# Patient Record
Sex: Male | Born: 1995 | Hispanic: No | Marital: Single | Smoking: Current every day smoker
Health system: Southern US, Community
[De-identification: ages and names within clinical notes are randomized; demographics above are authoritative.]

---

## 2020-01-24 ENCOUNTER — Emergency Department (INDEPENDENT_AMBULATORY_CARE_PROVIDER_SITE_OTHER): Payer: Self-pay

## 2020-01-24 ENCOUNTER — Other Ambulatory Visit: Payer: Self-pay

## 2020-01-24 ENCOUNTER — Emergency Department (INDEPENDENT_AMBULATORY_CARE_PROVIDER_SITE_OTHER)
Admission: EM | Admit: 2020-01-24 | Discharge: 2020-01-24 | Disposition: A | Discharge status: Home / Self Care | Payer: Self-pay | Source: Home / Self Care

## 2020-01-24 DIAGNOSIS — S61432A Puncture wound without foreign body of left hand, initial encounter: Secondary | ICD-10-CM

## 2020-01-24 DIAGNOSIS — Z23 Encounter for immunization: Secondary | ICD-10-CM

## 2020-01-24 DIAGNOSIS — W298XXA Contact with other powered powered hand tools and household machinery, initial encounter: Secondary | ICD-10-CM

## 2020-01-24 MED ORDER — TRAMADOL HCL 50 MG PO TABS: 50.0000 mg | ORAL_TABLET | Freq: Four times a day (QID) | ORAL | 0 refills | Status: AC | PRN

## 2020-01-24 MED ORDER — DOXYCYCLINE HYCLATE 100 MG PO CAPS: 100.0000 mg | ORAL_CAPSULE | Freq: Two times a day (BID) | ORAL | 0 refills | Status: AC

## 2020-01-24 MED ORDER — TETANUS-DIPHTH-ACELL PERTUSSIS 5-2.5-18.5 LF-MCG/0.5 IM SUSY
0.5000 mL | PREFILLED_SYRINGE | Freq: Once | INTRAMUSCULAR | Status: AC
  Administered 2020-01-24: 0.5 mL via INTRAMUSCULAR

## 2020-01-24 NOTE — ED Provider Notes (Signed)
[facility]    CSN: [id number] Arrival date & time: 01/24/20  1036      History   Chief Complaint Chief Complaint  Patient presents with  . Hand Injury    left    HPI Keith Arias is a 25 y.o. male.   HPI Keith Arias is a 25 y.o. male presenting to UC with c/o puncture wound to the palm of his Left hand that occurred around 9AM this morning while drilling through some wood. Bleeding controlled. Pain is 6/10. Denies weakness or numbness to his hand or fingers. No medication taken PTA.  He is Left hand dominant. Unknown last tetanus. Pt visiting from [place] for 1 month.    History reviewed. No pertinent past medical history.  There are no problems to display for this patient.   History reviewed. No pertinent surgical history.     Home Medications    Prior to Admission medications   Medication Sig Start Date End Date Taking? Authorizing Provider  doxycycline (VIBRAMYCIN) 100 MG capsule Take 1 capsule (100 mg total) by mouth 2 (two) times daily for 7 days. 01/24/20 01/31/20 Yes Davonda Ausley O, PA-C  traMADol (ULTRAM) 50 MG tablet Take 1 tablet (50 mg total) by mouth every 6 (six) hours as needed. 01/24/20  Yes Lurene Shadow, PA-C    Family History Family History  Problem Relation Age of Onset  . Healthy Mother   . Healthy Father     Social History Social History   Tobacco Use  . Smoking status: Current Every Day Smoker    Types: Cigars  . Smokeless tobacco: Never Used  . Tobacco comment: black & milds  Vaping Use  . Vaping Use: Never used  Substance Use Topics  . Alcohol use: Not Currently     Allergies   Patient has no known allergies.   Review of Systems Review of Systems  Skin: Positive for wound. Negative for color change.  Neurological: Negative for weakness and numbness.     Physical Exam Triage Vital Signs ED Triage Vitals  Enc Vitals Group     BP 01/24/20 1139 117/73     Pulse Rate 01/24/20 1139 93     Resp 01/24/20 1139  16     Temp 01/24/20 1139 97.9 F (36.6 C)     Temp Source 01/24/20 1139 Oral     SpO2 01/24/20 1139 100 %     Weight --      Height --      Head Circumference --      Peak Flow --      Pain Score 01/24/20 1137 6     Pain Loc --      Pain Edu? --      Excl. in GC? --    No data found.  Updated Vital Signs BP 117/73 (BP Location: Right Arm)   Pulse 93   Temp 97.9 F (36.6 C) (Oral)   Resp 16   SpO2 100%   Visual Acuity Right Eye Distance:   Left Eye Distance:   Bilateral Distance:    Right Eye Near:   Left Eye Near:    Bilateral Near:     Physical Exam Vitals and nursing note reviewed.  Constitutional:      Appearance: Normal appearance. He is well-developed and well-nourished.  HENT:     Head: Normocephalic and atraumatic.  Eyes:     Extraocular Movements: EOM normal.  Cardiovascular:     Rate and Rhythm: Normal rate.  Pulmonary:     Effort: Pulmonary effort is normal.  Musculoskeletal:        General: Tenderness present. No swelling. Normal range of motion.       Hands:     Cervical back: Normal range of motion.     Comments: Left hand and wrist: full ROM, mild tenderness around wound on palm aspect  Skin:    General: Skin is warm and dry.     Comments: Left hand, palm proximal aspect: 3cm puncture wound with jagged edges. No active bleeding. No foreign bodies. Mild tenderness.   Neurological:     Mental Status: He is alert and oriented to person, place, and time.  Psychiatric:        Mood and Affect: Mood and affect normal.        Behavior: Behavior normal.      UC Treatments / Results  Labs (all labs ordered are listed, but only abnormal results are displayed) Labs Reviewed - No data to display  EKG   Radiology DG Hand Complete Left  Result Date: 01/24/2020 CLINICAL DATA:  Drill bit extended into hand EXAM: LEFT HAND - COMPLETE 3+ VIEW COMPARISON:  None. FINDINGS: Frontal, oblique, and lateral views were obtained. No radiopaque foreign body  or soft tissue air evident. No fracture or dislocation. There is a small subchondral cyst in the fourth PIP joint. No associated joint space narrowing in this area. Other joint spaces appear normal. No erosive change. IMPRESSION: No fracture or dislocation. No soft tissue air or radiopaque foreign body. 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 Other joint spaces normal. Electronically Signed   By: Bretta Bang III M.D.   On: 01/24/2020 12:00    Procedures Laceration Repair  Date/Time: 01/24/2020 1:03 PM Performed by: Lurene Shadow, PA-C Authorized by: Lurene Shadow, PA-C   Consent:    Consent obtained:  Verbal   Consent given by:  Patient   Risks, benefits, and alternatives were discussed: yes     Risks discussed:  Infection, pain, poor cosmetic result, retained foreign body, tendon damage, poor wound healing, nerve damage and need for additional repair   Alternatives discussed:  No treatment and delayed treatment Anesthesia:    Anesthesia method:  None Laceration details:    Location:  Hand   Hand location:  L palm   Length (cm):  3   Depth (mm):  4 Pre-procedure details:    Preparation:  Patient was prepped and draped in usual sterile fashion Exploration:    Limited defect created (wound extended): no     Hemostasis achieved with:  Direct pressure   Imaging obtained: x-ray     Imaging outcome: foreign body not noted     Wound exploration: wound explored through full range of motion and entire depth of wound visualized     Wound extent: areolar tissue violated     Wound extent: no fascia violation noted, no foreign bodies/material noted, no muscle damage noted, no nerve damage noted, no tendon damage noted, no underlying fracture noted and no vascular damage noted     Contaminated: no   Treatment:    Area cleansed with:  Saline   Amount of cleaning:  Extensive   Irrigation solution:  Sterile water   Irrigation method:  Pressure wash   Debridement:  None    Undermining:  None Skin repair:    Repair method:  Steri-Strips   Number of Steri-Strips:  3 Approximation:    Approximation:  Close Repair  type:    Repair type:  Simple Post-procedure details:    Dressing:  Bulky dressing   Procedure completion:  Tolerated   (including critical care time)  Medications Ordered in UC Medications  Tdap (BOOSTRIX) injection 0.5 mL (0.5 mLs Intramuscular Given 01/24/20 1249)    Initial Impression / Assessment and Plan / UC Course  I have reviewed the triage vital signs and the nursing notes.  Pertinent labs & imaging results that were available during my care of the patient were reviewed by me and considered in my medical decision making (see chart for details).    Discussed imaging  Wound closed as noted above Tdap updated F/u later this week as needed  Final Clinical Impressions(s) / UC Diagnoses   Final diagnoses:  Puncture wound of left hand without foreign body, initial encounter     Discharge Instructions      Take the antibiotic as prescribed to help prevent infection. Keep bandage on for 24-48 hours then gently remove and gently clean wound. Do not soak your hand. Do not pick at the steri-strips and do not apply any cream or ointment on the tape. Keep wound covered with a bandage until healed. Change bandage once daily, more often if wet or dirty. Follow up with sports medicine later this week if not improving, or return to urgent care if needed.    ED Prescriptions    Medication Sig Dispense Auth. Provider   traMADol (ULTRAM) 50 MG tablet Take 1 tablet (50 mg total) by mouth every 6 (six) hours as needed. 10 tablet Waylan Rocher O, PA-C   doxycycline (VIBRAMYCIN) 100 MG capsule Take 1 capsule (100 mg total) by mouth 2 (two) times daily for 7 days. 14 capsule Lurene Shadow, [place]     I have reviewed the PDMP during this encounter.   Lurene Shadow, [place] 01/24/20 1305

## 2020-01-24 NOTE — ED Triage Notes (Signed)
Patient presents to Urgent Care with complaints of left hand injury since a drill accidentally went through it earlier today. Patient reports he has normal sensation and ROM, "it just hurts". Pt not sure how deep the drill went, bleeding controlled upon arrival. Pressure bandage applied. Unknown tetanus.

## 2020-01-24 NOTE — Discharge Instructions (Signed)
  Take the antibiotic as prescribed to help prevent infection. Keep bandage on for 24-48 hours then gently remove and gently clean wound. Do not soak your hand. Do not pick at the steri-strips and do not apply any cream or ointment on the tape. Keep wound covered with a bandage until healed. Change bandage once daily, more often if wet or dirty. Follow up with sports medicine later this week if not improving, or return to urgent care if needed.

## 2022-04-10 IMAGING — DX DG HAND COMPLETE 3+V*L*
3 series · 3 of 3 positions shown · non-contrast
Comparison: None.

CLINICAL DATA: Drill bit extended into hand

EXAM:
LEFT HAND - COMPLETE 3+ VIEW

[hand pa]
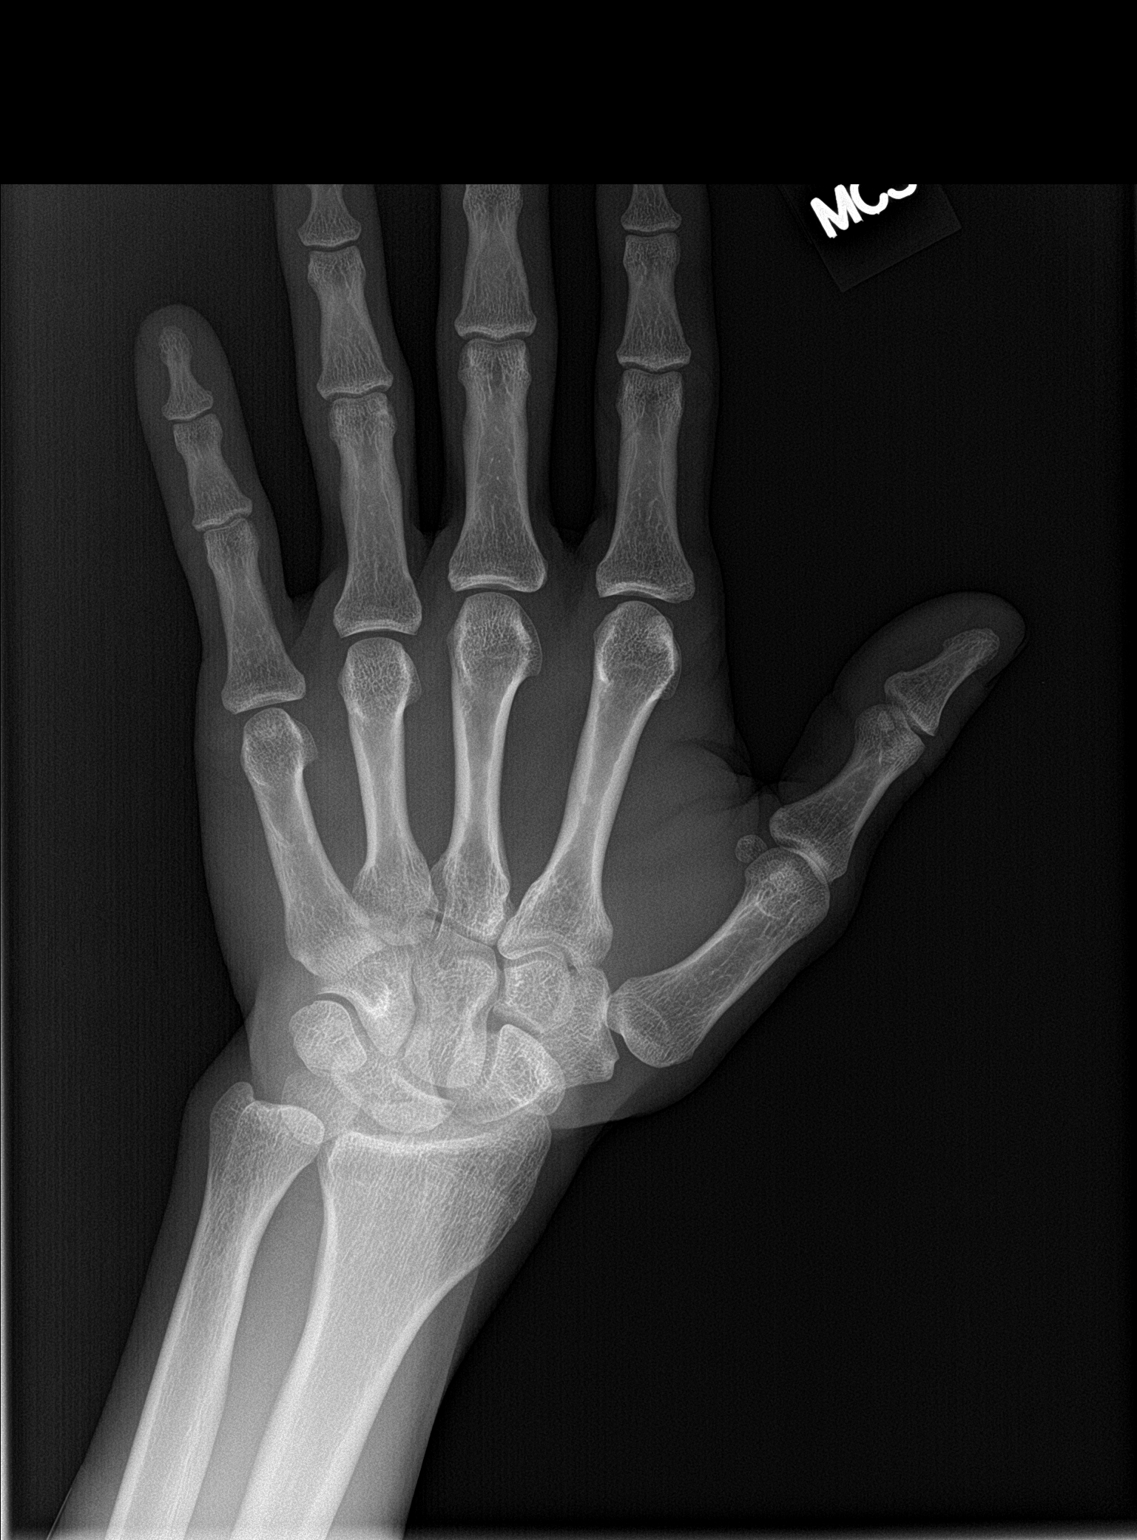

[hand obl]
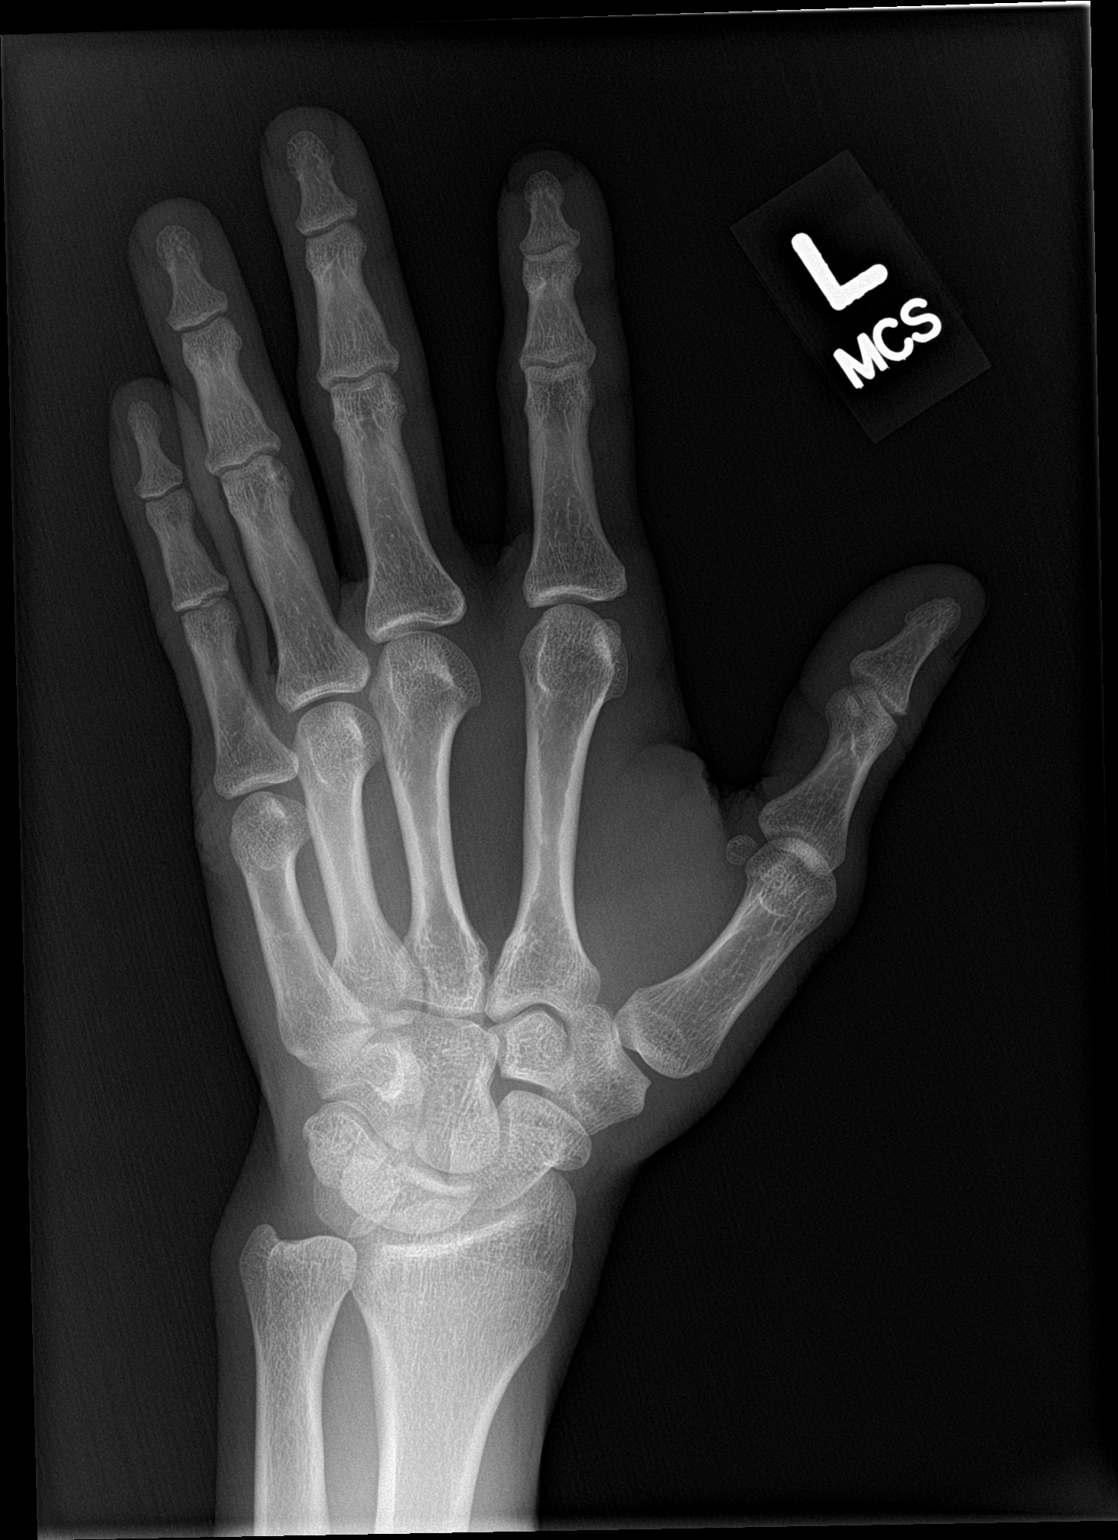

[hand lat]
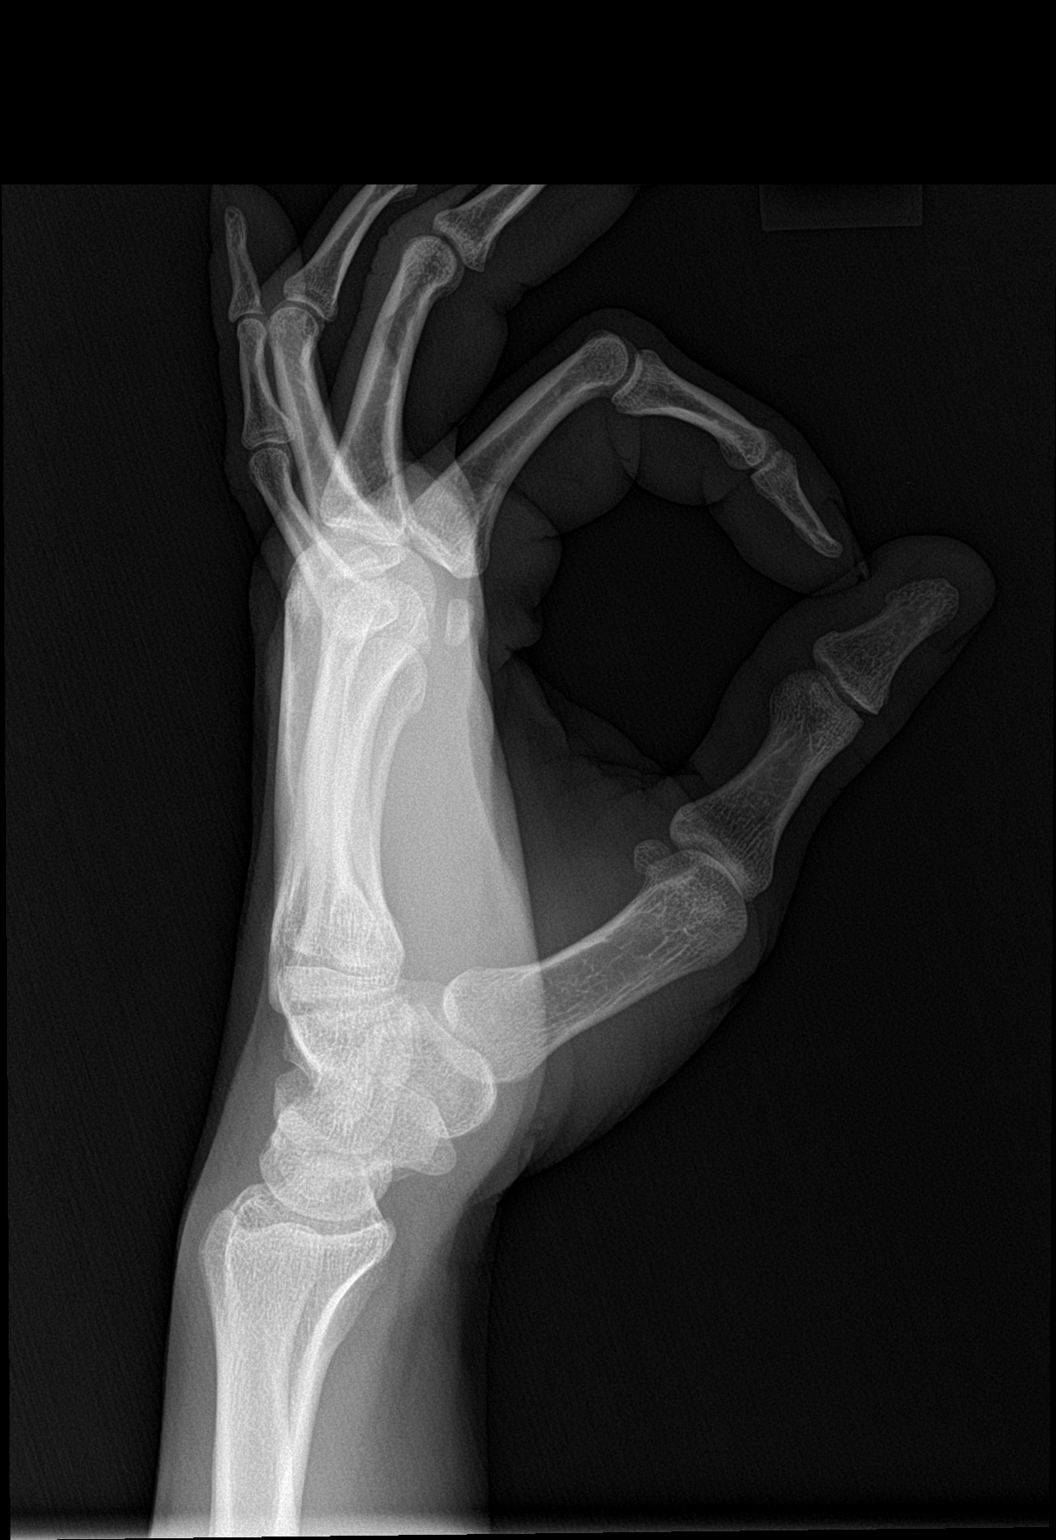

[3 of 3 positions shown; findings below may reference images not displayed]

FINDINGS: Frontal, oblique, and lateral views were obtained. No radiopaque
foreign body or soft tissue air evident. No fracture or dislocation.
There is a small subchondral cyst in the fourth PIP joint. No
associated joint space narrowing in this area. Other joint spaces
appear normal. No erosive change.
IMPRESSION: No fracture or dislocation. No soft tissue air or radiopaque foreign
body. Slight osteoarthritic change in the fourth PIP joint. Other
joint spaces normal.
